# Patient Record
Sex: Female | Born: 1965
Health system: Southern US, Community
[De-identification: ages and names within clinical notes are randomized; demographics above are authoritative.]

---

## 2001-07-10 ENCOUNTER — Other Ambulatory Visit: Admission: RE | Admit: 2001-07-10 | Discharge: 2001-07-10 | Payer: Self-pay | Admitting: Dermatology

## 2009-01-31 ENCOUNTER — Ambulatory Visit (HOSPITAL_COMMUNITY): Admission: RE | Admit: 2009-01-31 | Discharge: 2009-01-31 | Payer: Self-pay | Admitting: Family Medicine

## 2012-02-29 ENCOUNTER — Other Ambulatory Visit (HOSPITAL_COMMUNITY): Payer: Self-pay | Admitting: Internal Medicine

## 2012-02-29 DIAGNOSIS — Z139 Encounter for screening, unspecified: Secondary | ICD-10-CM

## 2012-03-06 ENCOUNTER — Ambulatory Visit (HOSPITAL_COMMUNITY)
Admission: RE | Admit: 2012-03-06 | Discharge: 2012-03-06 | Disposition: A | Discharge status: Home / Self Care | Payer: Managed Care, Other (non HMO) | Source: Ambulatory Visit | Attending: Internal Medicine | Admitting: Internal Medicine

## 2012-03-06 DIAGNOSIS — Z1231 Encounter for screening mammogram for malignant neoplasm of breast: Secondary | ICD-10-CM | POA: Insufficient documentation

## 2012-03-06 DIAGNOSIS — Z139 Encounter for screening, unspecified: Secondary | ICD-10-CM

## 2013-03-11 ENCOUNTER — Other Ambulatory Visit (HOSPITAL_COMMUNITY): Payer: Self-pay | Admitting: Internal Medicine

## 2013-03-11 DIAGNOSIS — Z0000 Encounter for general adult medical examination without abnormal findings: Secondary | ICD-10-CM

## 2013-03-23 ENCOUNTER — Ambulatory Visit (HOSPITAL_COMMUNITY)
Admission: RE | Admit: 2013-03-23 | Discharge: 2013-03-23 | Disposition: A | Discharge status: Home / Self Care | Payer: Managed Care, Other (non HMO) | Source: Ambulatory Visit | Attending: Internal Medicine | Admitting: Internal Medicine

## 2013-03-23 DIAGNOSIS — Z0000 Encounter for general adult medical examination without abnormal findings: Secondary | ICD-10-CM

## 2013-03-23 DIAGNOSIS — Z1231 Encounter for screening mammogram for malignant neoplasm of breast: Secondary | ICD-10-CM | POA: Insufficient documentation

## 2014-03-19 ENCOUNTER — Other Ambulatory Visit (HOSPITAL_COMMUNITY): Payer: Self-pay | Admitting: Family Medicine

## 2014-03-19 DIAGNOSIS — Z0000 Encounter for general adult medical examination without abnormal findings: Secondary | ICD-10-CM

## 2014-03-29 ENCOUNTER — Ambulatory Visit (HOSPITAL_COMMUNITY)
Admission: RE | Admit: 2014-03-29 | Discharge: 2014-03-29 | Disposition: A | Discharge status: Home / Self Care | Payer: Managed Care, Other (non HMO) | Source: Ambulatory Visit | Attending: Family Medicine | Admitting: Family Medicine

## 2014-03-29 DIAGNOSIS — Z0000 Encounter for general adult medical examination without abnormal findings: Secondary | ICD-10-CM

## 2014-03-29 DIAGNOSIS — Z1231 Encounter for screening mammogram for malignant neoplasm of breast: Secondary | ICD-10-CM | POA: Insufficient documentation

## 2014-11-25 ENCOUNTER — Ambulatory Visit (HOSPITAL_COMMUNITY)
Admission: RE | Admit: 2014-11-25 | Discharge: 2014-11-25 | Disposition: A | Discharge status: Home / Self Care | Payer: Managed Care, Other (non HMO) | Source: Ambulatory Visit | Attending: Family Medicine | Admitting: Family Medicine

## 2014-11-25 ENCOUNTER — Other Ambulatory Visit (HOSPITAL_COMMUNITY): Payer: Self-pay | Admitting: Family Medicine

## 2014-11-25 DIAGNOSIS — M25562 Pain in left knee: Secondary | ICD-10-CM | POA: Insufficient documentation

## 2015-04-07 ENCOUNTER — Other Ambulatory Visit (HOSPITAL_COMMUNITY): Payer: Self-pay | Admitting: Family Medicine

## 2015-04-07 DIAGNOSIS — Z139 Encounter for screening, unspecified: Secondary | ICD-10-CM

## 2015-04-14 ENCOUNTER — Ambulatory Visit (HOSPITAL_COMMUNITY)
Admission: RE | Admit: 2015-04-14 | Discharge: 2015-04-14 | Disposition: A | Discharge status: Home / Self Care | Payer: Managed Care, Other (non HMO) | Source: Ambulatory Visit | Attending: Family Medicine | Admitting: Family Medicine

## 2015-04-14 DIAGNOSIS — Z1231 Encounter for screening mammogram for malignant neoplasm of breast: Secondary | ICD-10-CM | POA: Diagnosis not present

## 2015-04-14 DIAGNOSIS — Z139 Encounter for screening, unspecified: Secondary | ICD-10-CM

## 2016-04-04 ENCOUNTER — Other Ambulatory Visit (HOSPITAL_COMMUNITY): Payer: Self-pay | Admitting: Family Medicine

## 2016-04-04 DIAGNOSIS — Z1231 Encounter for screening mammogram for malignant neoplasm of breast: Secondary | ICD-10-CM

## 2016-04-16 ENCOUNTER — Ambulatory Visit (HOSPITAL_COMMUNITY)
Admission: RE | Admit: 2016-04-16 | Discharge: 2016-04-16 | Disposition: A | Discharge status: Home / Self Care | Payer: 59 | Source: Ambulatory Visit | Attending: Family Medicine | Admitting: Family Medicine

## 2016-04-16 DIAGNOSIS — Z1231 Encounter for screening mammogram for malignant neoplasm of breast: Secondary | ICD-10-CM | POA: Diagnosis not present

## 2017-02-08 DIAGNOSIS — Z1389 Encounter for screening for other disorder: Secondary | ICD-10-CM | POA: Diagnosis not present

## 2017-02-08 DIAGNOSIS — E782 Mixed hyperlipidemia: Secondary | ICD-10-CM | POA: Diagnosis not present

## 2017-02-08 DIAGNOSIS — E119 Type 2 diabetes mellitus without complications: Secondary | ICD-10-CM | POA: Diagnosis not present

## 2017-02-08 DIAGNOSIS — E1165 Type 2 diabetes mellitus with hyperglycemia: Secondary | ICD-10-CM | POA: Diagnosis not present

## 2017-02-08 DIAGNOSIS — J302 Other seasonal allergic rhinitis: Secondary | ICD-10-CM | POA: Diagnosis not present

## 2017-07-19 DIAGNOSIS — E1165 Type 2 diabetes mellitus with hyperglycemia: Secondary | ICD-10-CM | POA: Diagnosis not present

## 2017-07-19 DIAGNOSIS — R002 Palpitations: Secondary | ICD-10-CM | POA: Diagnosis not present

## 2017-07-19 DIAGNOSIS — Z23 Encounter for immunization: Secondary | ICD-10-CM | POA: Diagnosis not present

## 2017-07-19 DIAGNOSIS — Z0001 Encounter for general adult medical examination with abnormal findings: Secondary | ICD-10-CM | POA: Diagnosis not present

## 2017-07-19 DIAGNOSIS — Z1389 Encounter for screening for other disorder: Secondary | ICD-10-CM | POA: Diagnosis not present

## 2017-08-26 DIAGNOSIS — B354 Tinea corporis: Secondary | ICD-10-CM | POA: Diagnosis not present

## 2017-08-26 DIAGNOSIS — L299 Pruritus, unspecified: Secondary | ICD-10-CM | POA: Diagnosis not present

## 2017-09-03 ENCOUNTER — Other Ambulatory Visit (HOSPITAL_COMMUNITY): Payer: Self-pay | Admitting: Family Medicine

## 2017-09-03 DIAGNOSIS — Z1231 Encounter for screening mammogram for malignant neoplasm of breast: Secondary | ICD-10-CM

## 2017-09-04 ENCOUNTER — Ambulatory Visit (HOSPITAL_COMMUNITY): Payer: 59

## 2017-09-05 ENCOUNTER — Encounter (HOSPITAL_COMMUNITY): Payer: Self-pay

## 2017-09-05 ENCOUNTER — Ambulatory Visit (HOSPITAL_COMMUNITY)
Admission: RE | Admit: 2017-09-05 | Discharge: 2017-09-05 | Disposition: A | Discharge status: Home / Self Care | Payer: 59 | Source: Ambulatory Visit | Attending: Family Medicine | Admitting: Family Medicine

## 2017-09-05 DIAGNOSIS — Z1231 Encounter for screening mammogram for malignant neoplasm of breast: Secondary | ICD-10-CM | POA: Insufficient documentation

## 2017-10-04 ENCOUNTER — Other Ambulatory Visit (HOSPITAL_COMMUNITY): Payer: Self-pay | Admitting: Family Medicine

## 2017-10-04 DIAGNOSIS — Z1231 Encounter for screening mammogram for malignant neoplasm of breast: Secondary | ICD-10-CM

## 2017-11-01 DIAGNOSIS — E119 Type 2 diabetes mellitus without complications: Secondary | ICD-10-CM | POA: Diagnosis not present

## 2017-11-01 DIAGNOSIS — E782 Mixed hyperlipidemia: Secondary | ICD-10-CM | POA: Diagnosis not present

## 2017-11-01 DIAGNOSIS — E1165 Type 2 diabetes mellitus with hyperglycemia: Secondary | ICD-10-CM | POA: Diagnosis not present

## 2017-12-13 DIAGNOSIS — J069 Acute upper respiratory infection, unspecified: Secondary | ICD-10-CM | POA: Diagnosis not present

## 2018-02-21 DIAGNOSIS — R7309 Other abnormal glucose: Secondary | ICD-10-CM | POA: Diagnosis not present

## 2018-02-21 DIAGNOSIS — I10 Essential (primary) hypertension: Secondary | ICD-10-CM | POA: Diagnosis not present

## 2018-02-21 DIAGNOSIS — E6609 Other obesity due to excess calories: Secondary | ICD-10-CM | POA: Diagnosis not present

## 2018-02-21 DIAGNOSIS — Z6836 Body mass index (BMI) 36.0-36.9, adult: Secondary | ICD-10-CM | POA: Diagnosis not present

## 2018-07-18 DIAGNOSIS — Z0001 Encounter for general adult medical examination with abnormal findings: Secondary | ICD-10-CM | POA: Diagnosis not present

## 2018-07-18 DIAGNOSIS — I10 Essential (primary) hypertension: Secondary | ICD-10-CM | POA: Diagnosis not present

## 2018-07-18 DIAGNOSIS — Z1389 Encounter for screening for other disorder: Secondary | ICD-10-CM | POA: Diagnosis not present

## 2018-07-18 DIAGNOSIS — Z23 Encounter for immunization: Secondary | ICD-10-CM | POA: Diagnosis not present

## 2018-07-18 DIAGNOSIS — R7309 Other abnormal glucose: Secondary | ICD-10-CM | POA: Diagnosis not present

## 2018-09-08 ENCOUNTER — Encounter (HOSPITAL_COMMUNITY): Payer: Self-pay | Admitting: Radiology

## 2018-09-08 ENCOUNTER — Ambulatory Visit (HOSPITAL_COMMUNITY)
Admission: RE | Admit: 2018-09-08 | Discharge: 2018-09-08 | Disposition: A | Discharge status: Home / Self Care | Payer: 59 | Source: Ambulatory Visit | Attending: Family Medicine | Admitting: Family Medicine

## 2018-09-08 DIAGNOSIS — Z1231 Encounter for screening mammogram for malignant neoplasm of breast: Secondary | ICD-10-CM | POA: Diagnosis present

## 2018-10-20 DIAGNOSIS — B351 Tinea unguium: Secondary | ICD-10-CM | POA: Diagnosis not present

## 2018-10-20 DIAGNOSIS — R7309 Other abnormal glucose: Secondary | ICD-10-CM | POA: Diagnosis not present

## 2019-09-11 DIAGNOSIS — Z23 Encounter for immunization: Secondary | ICD-10-CM | POA: Diagnosis not present

## 2019-09-11 DIAGNOSIS — R7309 Other abnormal glucose: Secondary | ICD-10-CM | POA: Diagnosis not present

## 2019-09-11 DIAGNOSIS — Z1389 Encounter for screening for other disorder: Secondary | ICD-10-CM | POA: Diagnosis not present

## 2019-09-11 DIAGNOSIS — J302 Other seasonal allergic rhinitis: Secondary | ICD-10-CM | POA: Diagnosis not present

## 2019-09-11 DIAGNOSIS — Z0001 Encounter for general adult medical examination with abnormal findings: Secondary | ICD-10-CM | POA: Diagnosis not present

## 2019-09-11 DIAGNOSIS — Z6836 Body mass index (BMI) 36.0-36.9, adult: Secondary | ICD-10-CM | POA: Diagnosis not present

## 2019-09-11 DIAGNOSIS — E6609 Other obesity due to excess calories: Secondary | ICD-10-CM | POA: Diagnosis not present

## 2019-09-25 ENCOUNTER — Ambulatory Visit: Payer: BC Managed Care – PPO | Admitting: Podiatry

## 2019-09-25 ENCOUNTER — Ambulatory Visit (INDEPENDENT_AMBULATORY_CARE_PROVIDER_SITE_OTHER): Payer: BC Managed Care – PPO

## 2019-09-25 ENCOUNTER — Other Ambulatory Visit: Payer: Self-pay

## 2019-09-25 DIAGNOSIS — L603 Nail dystrophy: Secondary | ICD-10-CM

## 2019-09-25 DIAGNOSIS — M7752 Other enthesopathy of left foot: Secondary | ICD-10-CM | POA: Diagnosis not present

## 2019-09-25 DIAGNOSIS — B351 Tinea unguium: Secondary | ICD-10-CM | POA: Diagnosis not present

## 2019-09-25 MED ORDER — FLUCONAZOLE 150 MG PO TABS: 150.0000 mg | ORAL_TABLET | ORAL | 1 refills | Status: DC

## 2019-09-25 NOTE — Patient Instructions (Signed)
Fungal Nail Infection A fungal nail infection is a common infection of the toenails or fingernails. This condition affects toenails more often than fingernails. It often affects the great, or big, toes. More than one nail may be infected. The condition can be passed from person to person (is contagious). What are the causes? This condition is caused by a fungus. Several types of fungi can cause the infection. These fungi are common in moist and warm areas. If your hands or feet come into contact with the fungus, it may get into a crack in your fingernail or toenail and cause the infection. What increases the risk? The following factors may make you more likely to develop this condition:  Being female.  Being of older age.  Living with someone who has the fungus.  Walking barefoot in areas where the fungus thrives, such as showers or locker rooms.  Wearing shoes and socks that cause your feet to sweat.  Having a nail injury or a recent nail surgery.  Having certain medical conditions, such as: ? Athlete's foot. ? Diabetes. ? Psoriasis. ? Poor circulation. ? A weak body defense system (immune system). What are the signs or symptoms? Symptoms of this condition include:  A pale spot on the nail.  Thickening of the nail.  A nail that becomes yellow or brown.  A brittle or ragged nail edge.  A crumbling nail.  A nail that has lifted away from the nail bed. How is this diagnosed? This condition is diagnosed with a physical exam. Your health care provider may take a scraping or clipping from your nail to test for the fungus. How is this treated? Treatment is not needed for mild infections. If you have significant nail changes, treatment may include:  Antifungal medicines taken by mouth (orally). You may need to take the medicine for several weeks or several months, and you may not see the results for a long time. These medicines can cause side effects. Ask your health care provider  what problems to watch for.  Antifungal nail polish or nail cream. These may be used along with oral antifungal medicines.  Laser treatment of the nail.  Surgery to remove the nail. This may be needed for the most severe infections. It can take a long time, usually up to a year, for the infection to go away. The infection may also come back. Follow these instructions at home: Medicines  Take or apply over-the-counter and prescription medicines only as told by your health care provider.  Ask your health care provider about using over-the-counter mentholated ointment on your nails. Nail care  Trim your nails often.  Wash and dry your hands and feet every day.  Keep your feet dry: ? Wear absorbent socks, and change your socks frequently. ? Wear shoes that allow air to circulate, such as sandals or canvas tennis shoes. Throw out old shoes.  Do not use artificial nails.  If you go to a nail salon, make sure you choose one that uses clean instruments.  Use antifungal foot powder on your feet and in your shoes. General instructions  Do not share personal items, such as towels or nail clippers.  Do not walk barefoot in shower rooms or locker rooms.  Wear rubber gloves if you are working with your hands in wet areas.  Keep all follow-up visits as told by your health care provider. This is important. Contact a health care provider if: Your infection is not getting better or it is getting worse   after several months. Summary  A fungal nail infection is a common infection of the toenails or fingernails.  Treatment is not needed for mild infections. If you have significant nail changes, treatment may include taking medicine orally and applying medicine to your nails.  It can take a long time, usually up to a year, for the infection to go away. The infection may also come back.  Take or apply over-the-counter and prescription medicines only as told by your health care provider.   Follow instructions for taking care of your nails to help prevent infection from coming back or spreading. This information is not intended to replace advice given to you by your health care provider. Make sure you discuss any questions you have with your health care provider. Document Released: 10/05/2000 Document Revised: 01/29/2019 Document Reviewed: 03/14/2018 Elsevier Patient Education  2020 [employer]  

## 2019-09-25 NOTE — Progress Notes (Signed)
  Subjective:  Patient ID: Haley Holmes, female    DOB: 1966-01-06,  MRN: [id number]  Chief Complaint  Patient presents with  . New Patient (Initial Visit)    b/l toe nail discoloration and thickness - possible fungus , pt has hx of getting them    53 y.o. female presents with the above complaint. Hx above confirmed with patient.  Review of Systems: Negative except as noted in the HPI. Denies N/V/F/Ch.  No past medical history on file.  Current Outpatient Medications:  .  fluconazole (DIFLUCAN) 150 MG tablet, Take 1 tablet (150 mg total) by mouth once a week., Disp: 6 tablet, Rfl: 1  Social History   Tobacco Use  Smoking Status Not on file    Allergies  Allergen Reactions  . Lamisil [Terbinafine Hcl] Rash   Objective:  There were no vitals filed for this visit. There is no height or weight on file to calculate BMI. Constitutional Well developed. Well nourished.  Vascular Dorsalis pedis pulses palpable bilaterally. Posterior tibial pulses palpable bilaterally. Capillary refill normal to all digits.  No cyanosis or clubbing noted. Pedal hair growth normal.  Neurologic Normal speech. Oriented to person, place, and time. Epicritic sensation to light touch grossly present bilaterally.  Dermatologic Nails thickened dystrophic yellow discoloration transverse ridging. No open wounds. No skin lesions.  Orthopedic: Normal joint ROM without pain or crepitus bilaterally. No visible deformities. No bony tenderness.   Radiographs: Taken and reviewed no underlying exostosis Assessment:   1. Bone spur of left foot   2. Onychomycosis   3. Nail dystrophy    Plan:  Patient was evaluated and treated and all questions answered.  Onychomycosis -XR taken no underlying exostosis -Educated on etiology of nail fungus. -eRx for weekly fluconazole. Educated on use. -Recommend tolcylen for traumatic component. -Recommended against removal ofhte nail at this time. -Nail debrided  with nail burr and nipper  Return in about 2 months (around 11/26/2019).

## 2019-09-29 ENCOUNTER — Other Ambulatory Visit (HOSPITAL_COMMUNITY): Payer: Self-pay | Admitting: Family Medicine

## 2019-09-29 DIAGNOSIS — Z1231 Encounter for screening mammogram for malignant neoplasm of breast: Secondary | ICD-10-CM

## 2019-10-30 ENCOUNTER — Telehealth: Payer: Self-pay | Admitting: Podiatry

## 2019-10-30 ENCOUNTER — Ambulatory Visit (HOSPITAL_COMMUNITY)
Admission: RE | Admit: 2019-10-30 | Discharge: 2019-10-30 | Disposition: A | Discharge status: Home / Self Care | Payer: BC Managed Care – PPO | Source: Ambulatory Visit | Attending: Family Medicine | Admitting: Family Medicine

## 2019-10-30 ENCOUNTER — Other Ambulatory Visit: Payer: Self-pay

## 2019-10-30 DIAGNOSIS — Z1231 Encounter for screening mammogram for malignant neoplasm of breast: Secondary | ICD-10-CM | POA: Insufficient documentation

## 2019-10-30 NOTE — Telephone Encounter (Signed)
Pts husband came by office stating they had bought a product from our office on 09/25/19 and used their Health [employer]SA Card to purchase the product. Pt was able to find a similar product at their drug store for much cheaper and would like to return it. The product has not been opened. Pts husband would like a call from our billing department to discuss processing the credit.

## 2019-11-27 ENCOUNTER — Other Ambulatory Visit: Payer: Self-pay

## 2019-11-27 ENCOUNTER — Ambulatory Visit: Payer: BC Managed Care – PPO | Admitting: Podiatry

## 2019-11-27 DIAGNOSIS — B351 Tinea unguium: Secondary | ICD-10-CM | POA: Diagnosis not present

## 2019-11-27 DIAGNOSIS — L603 Nail dystrophy: Secondary | ICD-10-CM | POA: Diagnosis not present

## 2019-11-27 NOTE — Progress Notes (Signed)
  Subjective:  Patient ID: Haley Holmes, female    DOB: 10-03-1966,  MRN: [id number]  Chief Complaint  Patient presents with  . Nail Problem    Left 1st toenail check. Pt denies pain and drainage. Pt states "I think it is doing better".    54 y.o. female presents with the above complaint. History confirmed with patient.   Objective:  Physical Exam: warm, good capillary refill, nail exam slight yellowing distally but proximal clear growth, no trophic changes or ulcerative lesions, normal DP and PT pulses and normal sensory exam. Left Foot: normal exam, no swelling, tenderness, instability; ligaments intact, full range of motion of all ankle/foot joints  Right Foot: normal exam, no swelling, tenderness, instability; ligaments intact, full range of motion of all ankle/foot joints   Assessment:   1. Onychomycosis   2. Nail dystrophy    Plan:  Patient was evaluated and treated and all questions answered.  Onychomycosis -Improving. -Continue fluconazole to completion -Nails gently debrided of excess thickness -Continue funginail solution -F/u PRN  Return if symptoms worsen or fail to improve.

## 2020-06-03 DIAGNOSIS — K648 Other hemorrhoids: Secondary | ICD-10-CM | POA: Diagnosis not present

## 2020-06-03 DIAGNOSIS — K5730 Diverticulosis of large intestine without perforation or abscess without bleeding: Secondary | ICD-10-CM | POA: Diagnosis not present

## 2020-06-03 DIAGNOSIS — K635 Polyp of colon: Secondary | ICD-10-CM | POA: Diagnosis not present

## 2020-06-03 DIAGNOSIS — D120 Benign neoplasm of cecum: Secondary | ICD-10-CM | POA: Diagnosis not present

## 2020-06-03 DIAGNOSIS — Z1211 Encounter for screening for malignant neoplasm of colon: Secondary | ICD-10-CM | POA: Diagnosis not present

## 2020-11-11 ENCOUNTER — Other Ambulatory Visit (HOSPITAL_COMMUNITY): Payer: Self-pay | Admitting: Family Medicine

## 2020-11-11 DIAGNOSIS — Z1231 Encounter for screening mammogram for malignant neoplasm of breast: Secondary | ICD-10-CM

## 2020-11-18 ENCOUNTER — Other Ambulatory Visit: Payer: Self-pay

## 2020-11-18 ENCOUNTER — Ambulatory Visit (HOSPITAL_COMMUNITY)
Admission: RE | Admit: 2020-11-18 | Discharge: 2020-11-18 | Disposition: A | Discharge status: Home / Self Care | Payer: 59 | Source: Ambulatory Visit | Attending: Family Medicine | Admitting: Family Medicine

## 2020-11-18 DIAGNOSIS — Z1231 Encounter for screening mammogram for malignant neoplasm of breast: Secondary | ICD-10-CM | POA: Diagnosis present

## 2021-08-14 IMAGING — MG DIGITAL SCREENING BILAT W/ TOMO W/ CAD
6 of 12 series · 6 of 36 positions shown · non-contrast
Comparison: Previous exam(s).

ACR Breast Density Category a: The breast tissue is almost entirely
fatty.

CLINICAL DATA: Screening.

EXAM:
DIGITAL SCREENING BILATERAL MAMMOGRAM WITH TOMO AND CAD

[R MLO synth-2D]
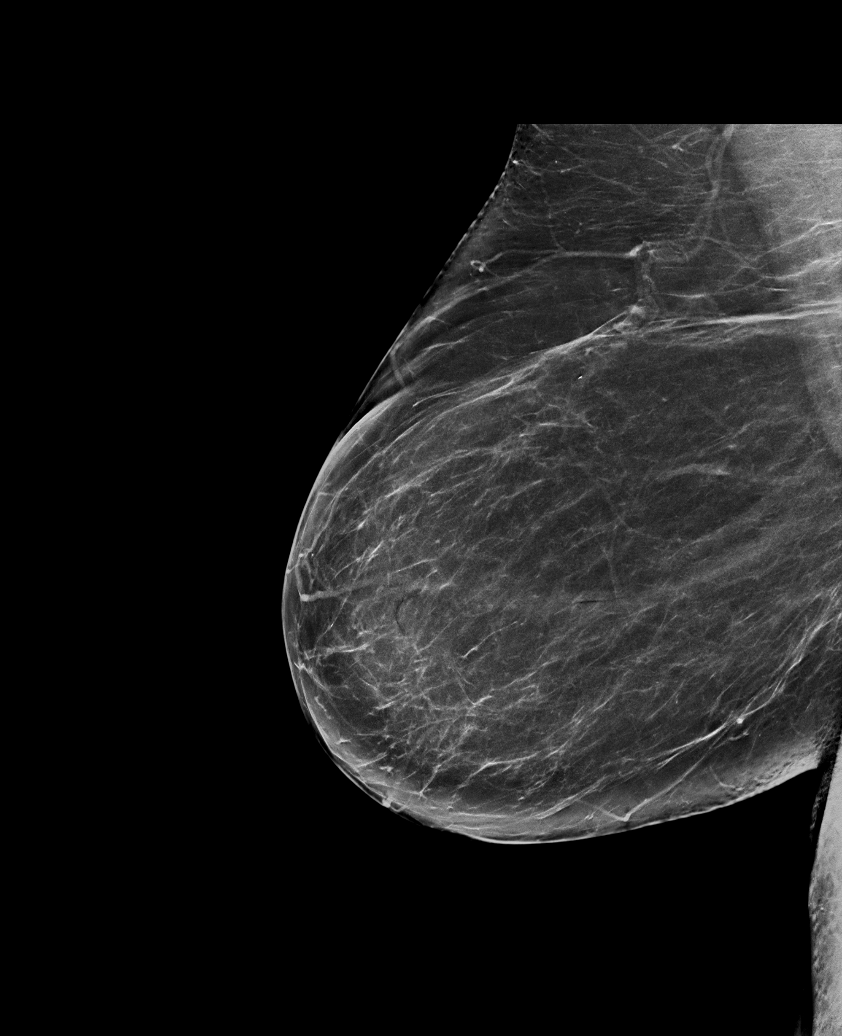

[L CC synth-2D (1 of 2)]
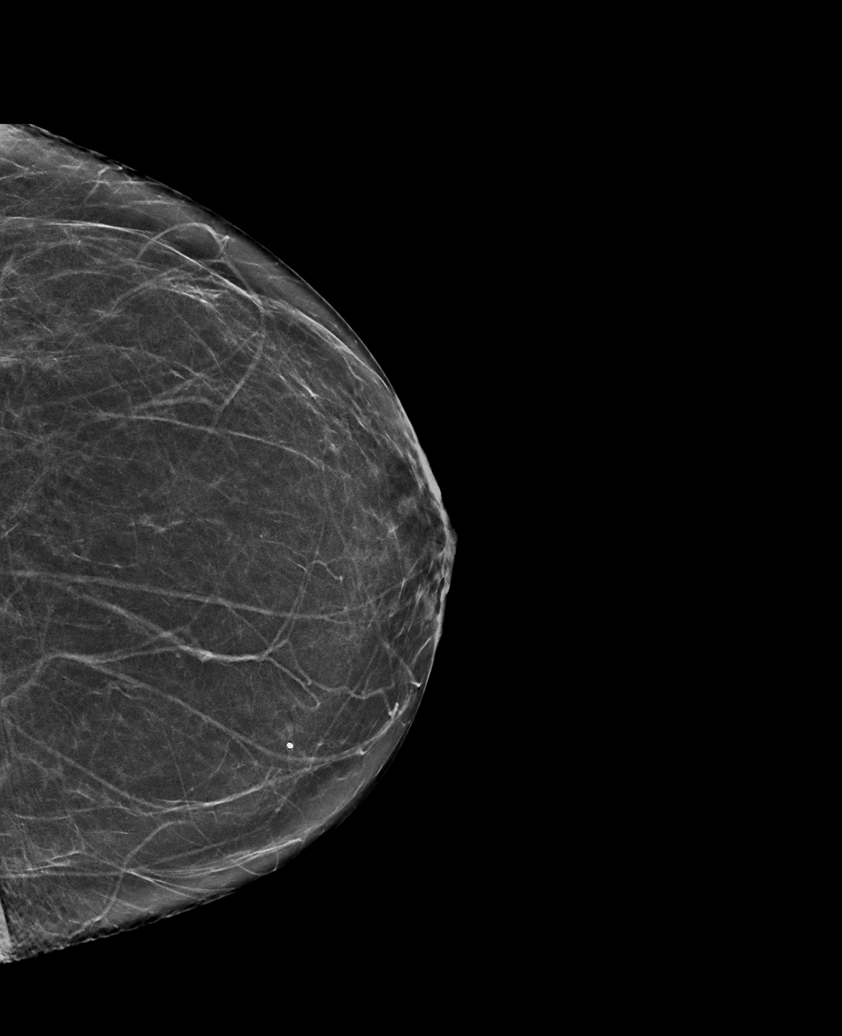

[R CC synth-2D (1 of 2)]
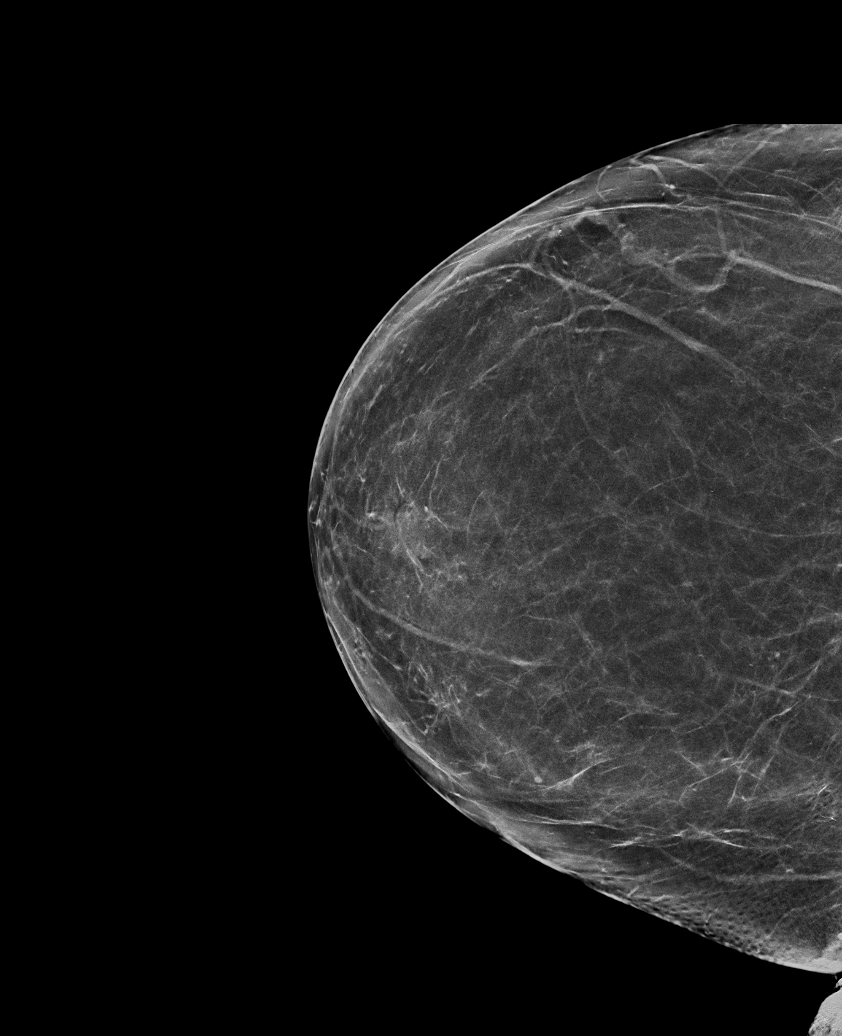

[L CC synth-2D (2 of 2)]
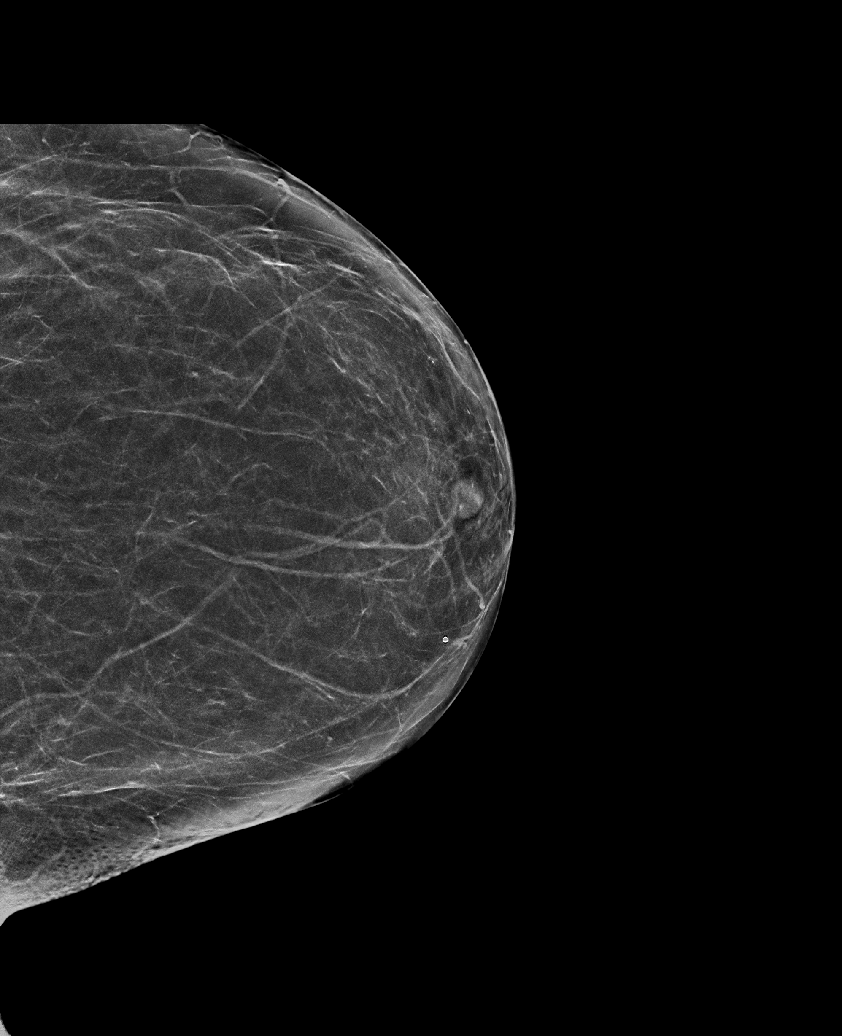

[R CC synth-2D (2 of 2)]
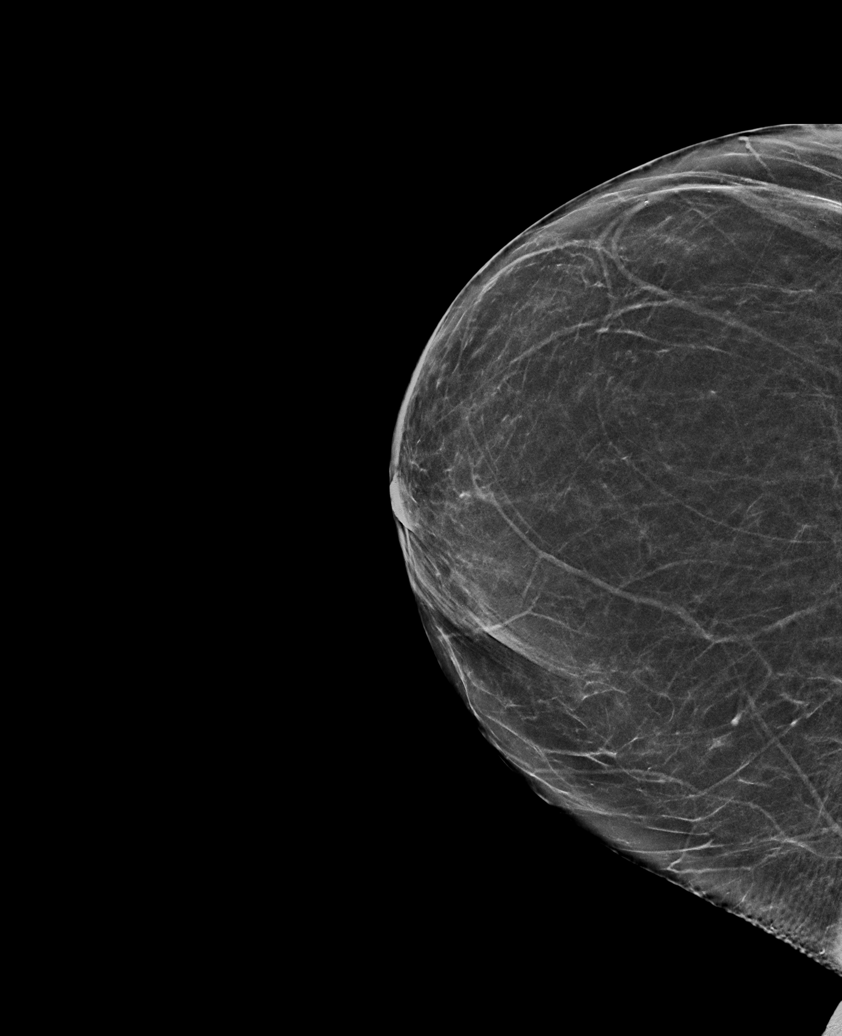

[L MLO synth-2D]
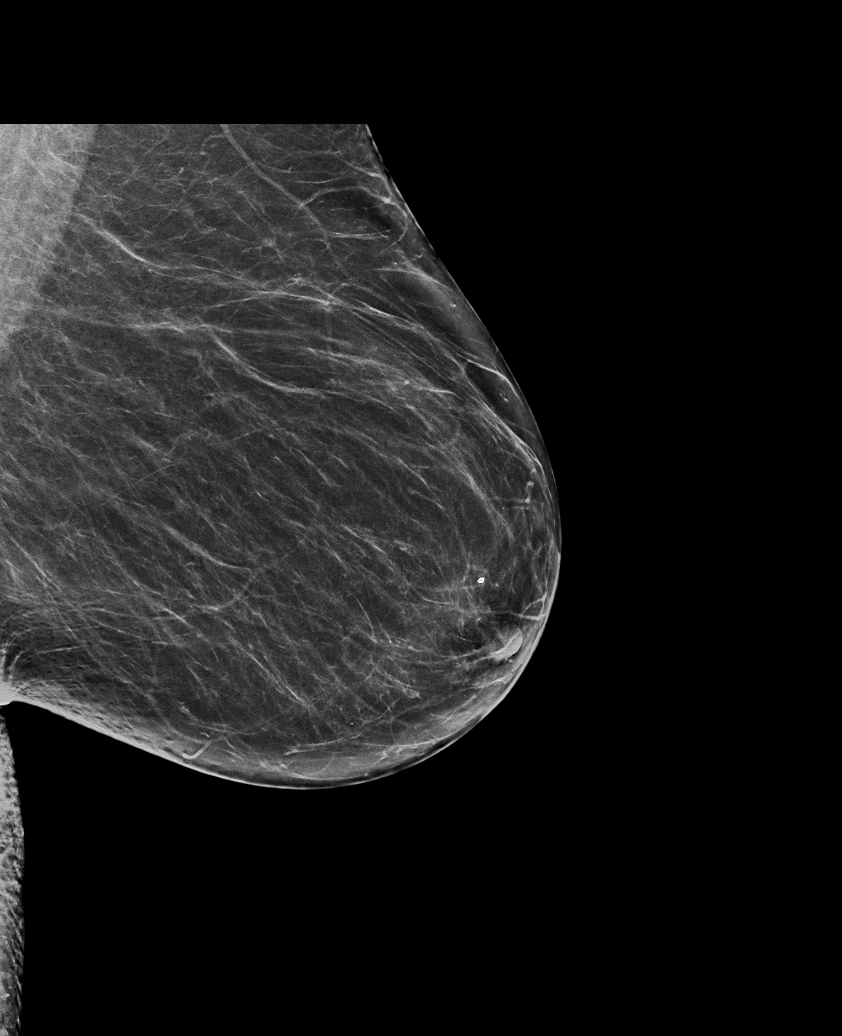

[6 of 36 positions shown; findings below may reference images not displayed]

FINDINGS: There are no findings suspicious for malignancy. Images were
processed with CAD.
IMPRESSION: No mammographic evidence of malignancy. A result letter of this
screening mammogram will be mailed directly to the patient.

RECOMMENDATION:
Screening mammogram in one year. (Code:[id number]-[id number])

BI-RADS CATEGORY  1: Negative.

## 2022-05-16 ENCOUNTER — Other Ambulatory Visit (HOSPITAL_COMMUNITY): Payer: Self-pay | Admitting: Family Medicine

## 2022-05-16 ENCOUNTER — Other Ambulatory Visit: Payer: Self-pay | Admitting: Family Medicine

## 2022-05-16 DIAGNOSIS — R1011 Right upper quadrant pain: Secondary | ICD-10-CM

## 2022-05-18 ENCOUNTER — Ambulatory Visit
Admission: RE | Admit: 2022-05-18 | Discharge: 2022-05-18 | Disposition: A | Discharge status: Home / Self Care | Payer: Self-pay | Source: Ambulatory Visit | Attending: Family Medicine | Admitting: Family Medicine

## 2022-05-18 DIAGNOSIS — R1011 Right upper quadrant pain: Secondary | ICD-10-CM

## 2022-06-29 ENCOUNTER — Other Ambulatory Visit: Payer: Self-pay | Admitting: Surgery

## 2022-10-05 ENCOUNTER — Other Ambulatory Visit (HOSPITAL_COMMUNITY): Payer: Self-pay | Admitting: Family Medicine

## 2022-10-05 DIAGNOSIS — Z0001 Encounter for general adult medical examination with abnormal findings: Secondary | ICD-10-CM | POA: Diagnosis not present

## 2022-10-05 DIAGNOSIS — E669 Obesity, unspecified: Secondary | ICD-10-CM | POA: Diagnosis not present

## 2022-10-05 DIAGNOSIS — Z1231 Encounter for screening mammogram for malignant neoplasm of breast: Secondary | ICD-10-CM

## 2022-10-08 ENCOUNTER — Ambulatory Visit (HOSPITAL_COMMUNITY)
Admission: RE | Admit: 2022-10-08 | Discharge: 2022-10-08 | Disposition: A | Discharge status: Home / Self Care | Payer: 59 | Source: Ambulatory Visit | Attending: Family Medicine | Admitting: Family Medicine

## 2022-10-08 DIAGNOSIS — Z1231 Encounter for screening mammogram for malignant neoplasm of breast: Secondary | ICD-10-CM | POA: Insufficient documentation

## 2022-12-21 ENCOUNTER — Other Ambulatory Visit: Payer: Self-pay | Admitting: Surgery

## 2022-12-27 ENCOUNTER — Other Ambulatory Visit: Payer: Self-pay

## 2022-12-27 ENCOUNTER — Encounter (HOSPITAL_BASED_OUTPATIENT_CLINIC_OR_DEPARTMENT_OTHER): Payer: Self-pay | Admitting: Surgery

## 2022-12-28 MED ORDER — ENSURE PRE-SURGERY PO LIQD: 296.0000 mL | Freq: Once | ORAL | Status: DC

## 2022-12-28 NOTE — Progress Notes (Signed)

## 2023-01-02 MED ORDER — VANCOMYCIN HCL 1000 MG IV SOLR
INTRAVENOUS | Status: AC
  Filled 2023-01-02: qty 20

## 2023-01-02 NOTE — H&P (Signed)
  REFERRING PHYSICIAN: Halford Chessman, MD  PROVIDER: Beverlee Nims, MD  MRN: [id number] DOB: May 21, 1966  Subjective  Chief Complaint: New Consultation (Gallstones)   History of Present Illness: Haley Holmes is a 57 y.o. female who is seen as an office consultation for evaluation of New Consultation (Gallstones) .  This is a very pleasant patient who is referred to me for symptomatic gallstones. Since January of this year she has had multiple attacks of right upper quadrant abdominal pain and bloating. Her most recent attack in July she also had nausea and vomiting for several days. She has significant sciatica and at that time was also placed on steroids. She has not had an attack since July. She describes her pain as sharp and severe at times. She denies jaundice. She is otherwise without complaints. She has had no previous abdominal surgery. She has no cardiopulmonary issues.  Review of Systems: A complete review of systems was obtained from the patient. I have reviewed this information and discussed as appropriate with the patient. See HPI as well for other ROS.  ROS  Medical History: History reviewed. No pertinent past medical history.  There is no problem list on file for this patient.  History reviewed. No pertinent surgical history.  Allergies Allergen Reactions Terbinafine Hcl Rash  No current outpatient medications on file prior to visit.  No current facility-administered medications on file prior to visit.  History reviewed. No pertinent family history.  Social History  Tobacco Use Smoking Status Never Smokeless Tobacco Never   Social History  Socioeconomic History Marital status: Married Tobacco Use Smoking status: Never Smokeless tobacco: Never Substance and Sexual Activity Alcohol use: Not Currently Drug use: Never  Objective:  Vitals:  BP: (!) 142/80 Pulse: 74 Temp: 36.2 C (97.1 F) SpO2: 99% Weight: 88.5 kg (195 lb 3.2  oz) Height: 157.5 cm (5\' 2" )  Body mass index is 35.7 kg/m.  Physical Exam  She appears well on exam today  Her abdomen is soft and nontender. There is no hepatomegaly  Labs, Imaging and Diagnostic Testing: I reviewed her ultrasound showing gallstones. The gallbladder wall is normal. There is no ductal dilation  Assessment and Plan:  Diagnoses and all orders for this visit:  Symptomatic cholelithiasis    I had a long discussion with the patient and her husband guarding symptomatic gallstones and the reasons to remove her gallbladder. I gave them literature regarding this. I explained the surgical procedure in detail. I discussed the risk of surgery which includes but is not limited to bleeding, infection, injury to surrounding structures, bile duct injury, bile leak, the need to convert to an open procedure, cardiopulmonary issues, postoperative recovery, etc. We discussed the risks of cholecystitis or pancreatitis without surgery. At this point, they are going to discuss this further once they get home and then decide on proceeding with surgery and the timing of the surgery. I encouraged her to call if she has any attack of significance or if she needs to discuss things further.

## 2023-01-03 ENCOUNTER — Ambulatory Visit (HOSPITAL_BASED_OUTPATIENT_CLINIC_OR_DEPARTMENT_OTHER)
Admission: RE | Admit: 2023-01-03 | Discharge: 2023-01-03 | Disposition: A | Discharge status: Home / Self Care | Payer: 59 | Attending: Surgery | Admitting: Surgery

## 2023-01-03 ENCOUNTER — Encounter (HOSPITAL_BASED_OUTPATIENT_CLINIC_OR_DEPARTMENT_OTHER): Payer: Self-pay | Admitting: Surgery

## 2023-01-03 ENCOUNTER — Encounter (HOSPITAL_BASED_OUTPATIENT_CLINIC_OR_DEPARTMENT_OTHER)
Admission: RE | Disposition: A | Discharge status: Home / Self Care | Payer: Self-pay | Source: Home / Self Care | Attending: Surgery

## 2023-01-03 ENCOUNTER — Ambulatory Visit (HOSPITAL_BASED_OUTPATIENT_CLINIC_OR_DEPARTMENT_OTHER): Payer: 59 | Admitting: Anesthesiology

## 2023-01-03 ENCOUNTER — Other Ambulatory Visit: Payer: Self-pay

## 2023-01-03 DIAGNOSIS — K8020 Calculus of gallbladder without cholecystitis without obstruction: Secondary | ICD-10-CM | POA: Diagnosis not present

## 2023-01-03 DIAGNOSIS — K8010 Calculus of gallbladder with chronic cholecystitis without obstruction: Secondary | ICD-10-CM | POA: Insufficient documentation

## 2023-01-03 HISTORY — PX: CHOLECYSTECTOMY: SHX55

## 2023-01-03 SURGERY — LAPAROSCOPIC CHOLECYSTECTOMY
Anesthesia: General | Site: Abdomen

## 2023-01-03 MED ORDER — AMISULPRIDE (ANTIEMETIC) 5 MG/2ML IV SOLN
10.0000 mg | Freq: Once | INTRAVENOUS | Status: AC
  Administered 2023-01-03: 10 mg via INTRAVENOUS

## 2023-01-03 MED ORDER — KETOROLAC TROMETHAMINE 30 MG/ML IJ SOLN
INTRAMUSCULAR | Status: DC | PRN
  Administered 2023-01-03: 30 mg via INTRAVENOUS

## 2023-01-03 MED ORDER — SCOPOLAMINE 1 MG/3DAYS TD PT72
MEDICATED_PATCH | TRANSDERMAL | Status: AC
  Filled 2023-01-03: qty 1

## 2023-01-03 MED ORDER — CEFAZOLIN SODIUM-DEXTROSE 2-4 GM/100ML-% IV SOLN
2.0000 g | INTRAVENOUS | Status: AC
  Administered 2023-01-03: 2 g via INTRAVENOUS

## 2023-01-03 MED ORDER — DEXAMETHASONE SODIUM PHOSPHATE 4 MG/ML IJ SOLN
INTRAMUSCULAR | Status: DC | PRN
  Administered 2023-01-03: 8 mg via INTRAVENOUS

## 2023-01-03 MED ORDER — LACTATED RINGERS IV SOLN: INTRAVENOUS | Status: DC

## 2023-01-03 MED ORDER — LIDOCAINE HCL (CARDIAC) PF 100 MG/5ML IV SOSY
PREFILLED_SYRINGE | INTRAVENOUS | Status: DC | PRN
  Administered 2023-01-03: 50 mg via INTRAVENOUS

## 2023-01-03 MED ORDER — FENTANYL CITRATE (PF) 100 MCG/2ML IJ SOLN
INTRAMUSCULAR | Status: AC
  Filled 2023-01-03: qty 2

## 2023-01-03 MED ORDER — CEFAZOLIN SODIUM-DEXTROSE 2-4 GM/100ML-% IV SOLN
INTRAVENOUS | Status: AC
  Filled 2023-01-03: qty 100

## 2023-01-03 MED ORDER — ACETAMINOPHEN 500 MG PO TABS
1000.0000 mg | ORAL_TABLET | ORAL | Status: AC
  Administered 2023-01-03: 1000 mg via ORAL

## 2023-01-03 MED ORDER — LIDOCAINE 2% (20 MG/ML) 5 ML SYRINGE
INTRAMUSCULAR | Status: AC
  Filled 2023-01-03: qty 5

## 2023-01-03 MED ORDER — DEXAMETHASONE SODIUM PHOSPHATE 10 MG/ML IJ SOLN
INTRAMUSCULAR | Status: AC
  Filled 2023-01-03: qty 1

## 2023-01-03 MED ORDER — OXYCODONE HCL 5 MG PO TABS: 5.0000 mg | ORAL_TABLET | Freq: Four times a day (QID) | ORAL | 0 refills | Status: AC | PRN

## 2023-01-03 MED ORDER — HYDROMORPHONE HCL 1 MG/ML IJ SOLN
INTRAMUSCULAR | Status: AC
  Filled 2023-01-03: qty 0.5

## 2023-01-03 MED ORDER — MIDAZOLAM HCL 5 MG/5ML IJ SOLN
INTRAMUSCULAR | Status: DC | PRN
  Administered 2023-01-03: 2 mg via INTRAVENOUS

## 2023-01-03 MED ORDER — ROCURONIUM BROMIDE 10 MG/ML (PF) SYRINGE
PREFILLED_SYRINGE | INTRAVENOUS | Status: AC
  Filled 2023-01-03: qty 10

## 2023-01-03 MED ORDER — PROPOFOL 10 MG/ML IV BOLUS
INTRAVENOUS | Status: AC
  Filled 2023-01-03: qty 20

## 2023-01-03 MED ORDER — ACETAMINOPHEN 500 MG PO TABS
ORAL_TABLET | ORAL | Status: AC
  Filled 2023-01-03: qty 2

## 2023-01-03 MED ORDER — AMISULPRIDE (ANTIEMETIC) 5 MG/2ML IV SOLN
INTRAVENOUS | Status: AC
  Filled 2023-01-03: qty 4

## 2023-01-03 MED ORDER — ROCURONIUM BROMIDE 100 MG/10ML IV SOLN
INTRAVENOUS | Status: DC | PRN
  Administered 2023-01-03: 50 mg via INTRAVENOUS

## 2023-01-03 MED ORDER — DEXMEDETOMIDINE HCL IN NACL 80 MCG/20ML IV SOLN
INTRAVENOUS | Status: DC | PRN
  Administered 2023-01-03: 8 ug via BUCCAL

## 2023-01-03 MED ORDER — SCOPOLAMINE 1 MG/3DAYS TD PT72
1.0000 | MEDICATED_PATCH | TRANSDERMAL | Status: DC
  Administered 2023-01-03: 1.5 mg via TRANSDERMAL

## 2023-01-03 MED ORDER — CHLORHEXIDINE GLUCONATE CLOTH 2 % EX PADS: 6.0000 | MEDICATED_PAD | Freq: Once | CUTANEOUS | Status: DC

## 2023-01-03 MED ORDER — FENTANYL CITRATE (PF) 100 MCG/2ML IJ SOLN
INTRAMUSCULAR | Status: DC | PRN
  Administered 2023-01-03: 100 ug via INTRAVENOUS

## 2023-01-03 MED ORDER — KETOROLAC TROMETHAMINE 30 MG/ML IJ SOLN
INTRAMUSCULAR | Status: AC
  Filled 2023-01-03: qty 1

## 2023-01-03 MED ORDER — ONDANSETRON HCL 4 MG/2ML IJ SOLN
INTRAMUSCULAR | Status: AC
  Filled 2023-01-03: qty 2

## 2023-01-03 MED ORDER — ONDANSETRON HCL 4 MG/2ML IJ SOLN
INTRAMUSCULAR | Status: DC | PRN
  Administered 2023-01-03: 4 mg via INTRAVENOUS

## 2023-01-03 MED ORDER — OXYCODONE HCL 5 MG PO TABS
5.0000 mg | ORAL_TABLET | Freq: Once | ORAL | Status: AC
  Administered 2023-01-03: 5 mg via ORAL

## 2023-01-03 MED ORDER — SUGAMMADEX SODIUM 200 MG/2ML IV SOLN
INTRAVENOUS | Status: DC | PRN
  Administered 2023-01-03: 200 mg via INTRAVENOUS

## 2023-01-03 MED ORDER — PROPOFOL 10 MG/ML IV BOLUS
INTRAVENOUS | Status: DC | PRN
  Administered 2023-01-03: 180 mg via INTRAVENOUS

## 2023-01-03 MED ORDER — HYDROMORPHONE HCL 1 MG/ML IJ SOLN
0.2500 mg | INTRAMUSCULAR | Status: DC | PRN
  Administered 2023-01-03 (×2): 0.5 mg via INTRAVENOUS

## 2023-01-03 MED ORDER — BUPIVACAINE HCL (PF) 0.5 % IJ SOLN
INTRAMUSCULAR | Status: DC | PRN
  Administered 2023-01-03: 20 mL

## 2023-01-03 MED ORDER — MIDAZOLAM HCL 2 MG/2ML IJ SOLN
INTRAMUSCULAR | Status: AC
  Filled 2023-01-03: qty 2

## 2023-01-03 MED ORDER — OXYCODONE HCL 5 MG PO TABS
ORAL_TABLET | ORAL | Status: AC
  Filled 2023-01-03: qty 1

## 2023-01-03 SURGICAL SUPPLY — 38 items
ADH SKN CLS APL DERMABOND .7 (GAUZE/BANDAGES/DRESSINGS) ×1
APL PRP STRL LF DISP 70% ISPRP (MISCELLANEOUS) ×1
APPLIER CLIP 5 13 M/L LIGAMAX5 (MISCELLANEOUS) ×1
APR CLP MED LRG 5 ANG JAW (MISCELLANEOUS) ×1
BLADE CLIPPER SURG (BLADE) IMPLANT
CHLORAPREP W/TINT 26 (MISCELLANEOUS) ×1 IMPLANT
CLIP APPLIE 5 13 M/L LIGAMAX5 (MISCELLANEOUS) ×1 IMPLANT
COVER MAYO STAND STRL (DRAPES) IMPLANT
DERMABOND ADVANCED .7 DNX12 (GAUZE/BANDAGES/DRESSINGS) ×1 IMPLANT
DRAPE C-ARM 42X72 X-RAY (DRAPES) IMPLANT
ELECT REM PT RETURN 9FT ADLT (ELECTROSURGICAL) ×1
ELECTRODE REM PT RTRN 9FT ADLT (ELECTROSURGICAL) ×1 IMPLANT
GAUZE 4X4 16PLY ~~LOC~~+RFID DBL (SPONGE) ×1 IMPLANT
GLOVE SURG SIGNA 7.5 PF LTX (GLOVE) ×1 IMPLANT
GOWN STRL REUS W/ TWL LRG LVL3 (GOWN DISPOSABLE) ×2 IMPLANT
GOWN STRL REUS W/ TWL XL LVL3 (GOWN DISPOSABLE) ×1 IMPLANT
GOWN STRL REUS W/TWL LRG LVL3 (GOWN DISPOSABLE) ×2
GOWN STRL REUS W/TWL XL LVL3 (GOWN DISPOSABLE) ×1
HEMOSTAT SNOW SURGICEL 2X4 (HEMOSTASIS) IMPLANT
IRRIG SUCT STRYKERFLOW 2 WTIP (MISCELLANEOUS) ×1
IRRIGATION SUCT STRKRFLW 2 WTP (MISCELLANEOUS) ×1 IMPLANT
NS IRRIG 1000ML POUR BTL (IV SOLUTION) ×1 IMPLANT
PACK BASIN DAY SURGERY FS (CUSTOM PROCEDURE TRAY) ×1 IMPLANT
SCISSORS LAP 5X35 DISP (ENDOMECHANICALS) ×1 IMPLANT
SET CHOLANGIOGRAPH 5 50 .035 (SET/KITS/TRAYS/PACK) IMPLANT
SET TUBE SMOKE EVAC HIGH FLOW (TUBING) ×1 IMPLANT
SLEEVE ADV FIXATION 5X100MM (TROCAR) ×2 IMPLANT
SLEEVE SCD COMPRESS KNEE MED (STOCKING) ×1 IMPLANT
SPECIMEN JAR SMALL (MISCELLANEOUS) ×1 IMPLANT
SPIKE FLUID TRANSFER (MISCELLANEOUS) ×1 IMPLANT
SUT MON AB 4-0 PC3 18 (SUTURE) ×1 IMPLANT
SYS BAG RETRIEVAL 10MM (BASKET) ×1
SYSTEM BAG RETRIEVAL 10MM (BASKET) ×1 IMPLANT
TOWEL GREEN STERILE FF (TOWEL DISPOSABLE) ×1 IMPLANT
TRAY LAPAROSCOPIC (CUSTOM PROCEDURE TRAY) ×1 IMPLANT
TROCAR XCEL BLUNT TIP 100MML (ENDOMECHANICALS) ×1 IMPLANT
TROCAR XCEL NON-BLD 5MMX100MML (ENDOMECHANICALS) ×1 IMPLANT
TUBE CONNECTING 20X1/4 (TUBING) ×1 IMPLANT

## 2023-01-03 NOTE — Discharge Instructions (Addendum)
  Post Anesthesia Home Care Instructions  Activity: Get plenty of rest for the remainder of the day. A responsible individual must stay with you for 24 hours following the procedure.  For the next 24 hours, DO NOT: -Drive a car -Paediatric nurse -Drink alcoholic beverages -Take any medication unless instructed by your physician -Make any legal decisions or sign important papers.  Meals: Start with liquid foods such as gelatin or soup. Progress to regular foods as tolerated. Avoid greasy, spicy, heavy foods. If nausea and/or vomiting occur, drink only clear liquids until the nausea and/or vomiting subsides. Call your physician if vomiting continues.  Special Instructions/Symptoms: Your throat may feel dry or sore from the anesthesia or the breathing tube placed in your throat during surgery. If this causes discomfort, gargle with warm salt water. The discomfort should disappear within 24 hours.  Next dose of Tylenol can be taken at 430pm today if needed. Next dose of Ibuprofen can be taken today at 630pm today if needed.

## 2023-01-03 NOTE — Anesthesia Preprocedure Evaluation (Addendum)
Anesthesia Evaluation  Patient identified by MRN, date of birth, ID band Patient awake    Reviewed: Allergy & Precautions, H&P , NPO status , Patient's Chart, lab work & pertinent test results  Airway Mallampati: II  TM Distance: >3 FB Neck ROM: Full    Dental no notable dental hx. (+) Teeth Intact, Dental Advisory Given   Pulmonary neg pulmonary ROS   Pulmonary exam normal breath sounds clear to auscultation       Cardiovascular negative cardio ROS  Rhythm:Regular Rate:Normal     Neuro/Psych negative neurological ROS  negative psych ROS   GI/Hepatic negative GI ROS, Neg liver ROS,,,  Endo/Other  negative endocrine ROS    Renal/GU negative Renal ROS  negative genitourinary   Musculoskeletal   Abdominal   Peds  Hematology negative hematology ROS (+)   Anesthesia Other Findings   Reproductive/Obstetrics negative OB ROS                             Anesthesia Physical Anesthesia Plan  ASA: 2  Anesthesia Plan: General   Post-op Pain Management: Tylenol PO (pre-op)*   Induction: Intravenous  PONV Risk Score and Plan: 4 or greater and Ondansetron, Dexamethasone and Midazolam  Airway Management Planned: Oral ETT  Additional Equipment:   Intra-op Plan:   Post-operative Plan: Extubation in OR  Informed Consent: I have reviewed the patients History and Physical, chart, labs and discussed the procedure including the risks, benefits and alternatives for the proposed anesthesia with the patient or authorized representative who has indicated his/her understanding and acceptance.     Dental advisory given  Plan Discussed with: CRNA  Anesthesia Plan Comments:        Anesthesia Quick Evaluation

## 2023-01-03 NOTE — Op Note (Signed)
Laparoscopic Cholecystectomy Procedure Note  Indications: This patient presents with symptomatic gallbladder disease and will undergo laparoscopic cholecystectomy.  Pre-operative Diagnosis: symptomatic cholelithiasis  Post-operative Diagnosis: same  Surgeon: Coralie Keens   Assistants: none  Anesthesia: General endotracheal anesthesia  ASA Class: 2  Procedure Details  The patient was seen again in the Holding Room. The risks, benefits, complications, treatment options, and expected outcomes were discussed with the patient. The possibilities of reaction to medication, pulmonary aspiration, perforation of viscus, bleeding, recurrent infection, finding a normal gallbladder, the need for additional procedures, failure to diagnose a condition, the possible need to convert to an open procedure, and creating a complication requiring transfusion or operation were discussed with the patient. The likelihood of improving the patient's symptoms with return to their baseline status is good.  The patient and/or family concurred with the proposed plan, giving informed consent. The site of surgery properly noted. The patient was taken to Operating Room, identified as Haley Holmes and the procedure verified as Laparoscopic Cholecystectomy with Intraoperative Cholangiogram. A Time Out was held and the above information confirmed.  Prior to the induction of general anesthesia, antibiotic prophylaxis was administered. General endotracheal anesthesia was then administered and tolerated well. After the induction, the abdomen was prepped with Chloraprep and draped in sterile fashion. The patient was positioned in the supine position.  Local anesthetic agent was injected into the skin near the umbilicus and an incision made. We dissected down to the abdominal fascia with blunt dissection.  The fascia was incised vertically and we entered the peritoneal cavity bluntly.  A pursestring suture of 0-Vicryl was placed  around the fascial opening.  The Hasson cannula was inserted and secured with the stay suture.  Pneumoperitoneum was then created with CO2 and tolerated well without any adverse changes in the patient's vital signs. A 5-mm port was placed in the subxiphoid position.  Two 5-mm ports were placed in the right upper quadrant. All skin incisions were infiltrated with a local anesthetic agent before making the incision and placing the trocars.   We positioned the patient in reverse Trendelenburg, tilted slightly to the patient's left.  The gallbladder was identified, the fundus grasped and retracted cephalad. Adhesions were lysed bluntly and with the electrocautery where indicated, taking care not to injure any adjacent organs or viscus. The infundibulum was grasped and retracted laterally, exposing the peritoneum overlying the triangle of Calot. This was then divided and exposed in a blunt fashion. The cystic duct was clearly identified and bluntly dissected circumferentially. A critical view of the cystic duct and cystic artery was obtained.  The cystic duct was then ligated with clips and divided. The cystic artery was, dissected free, ligated with clips and divided as well.   The gallbladder was dissected from the liver bed in retrograde fashion with the electrocautery. The gallbladder was removed and placed in an Endocatch sac. The liver bed was irrigated and inspected. Hemostasis was achieved with the electrocautery and a piece of surgical Snow. Copious irrigation was utilized and was repeatedly aspirated until clear.  The gallbladder and Endocatch sac were then removed through the umbilical port site.  The pursestring suture was used to close the umbilical fascia.    We again inspected the right upper quadrant for hemostasis.  Pneumoperitoneum was released as we removed the trocars.  4-0 Monocryl was used to close the skin.   Skin glue was then applied. The patient was then extubated and brought to the  recovery room in stable condition.  Instrument, sponge, and needle counts were correct at closure and at the conclusion of the case.   Findings: Chronic cholecystitis with Cholelithiasis  Estimated Blood Loss: Minimal         Drains: none         Specimens: Gallbladder           Complications: None; patient tolerated the procedure well.         Disposition: PACU - hemodynamically stable.         Condition: stable

## 2023-01-03 NOTE — Interval H&P Note (Signed)
History and Physical Interval Note: no change in H and P  01/03/2023 10:28 AM  Jobe Marker Caudillo  has presented today for surgery, with the diagnosis of SYMPTAMATIC GALLSTONES.  The various methods of treatment have been discussed with the patient and family. After consideration of risks, benefits and other options for treatment, the patient has consented to  Procedure(s): LAPAROSCOPIC CHOLECYSTECTOMY (N/A) as a surgical intervention.  The patient's history has been reviewed, patient examined, no change in status, stable for surgery.  I have reviewed the patient's chart and labs.  Questions were answered to the patient's satisfaction.     Coralie Keens

## 2023-01-03 NOTE — Anesthesia Procedure Notes (Signed)
Procedure Name: Intubation Date/Time: 01/03/2023 11:56 AM  Performed by: Bufford Spikes, CRNAPre-anesthesia Checklist: Patient identified, Emergency Drugs available, Suction available and Patient being monitored Patient Re-evaluated:Patient Re-evaluated prior to induction Oxygen Delivery Method: Circle system utilized Preoxygenation: Pre-oxygenation with 100% oxygen Induction Type: IV induction Ventilation: Mask ventilation without difficulty Laryngoscope Size: Miller and 2 Grade View: Grade I Tube type: Oral Tube size: 7.0 mm Number of attempts: 1 Airway Equipment and Method: Stylet and Oral airway Placement Confirmation: ETT inserted through vocal cords under direct vision, positive ETCO2 and breath sounds checked- equal and bilateral Secured at: 22 cm Tube secured with: Tape Dental Injury: Teeth and Oropharynx as per pre-operative assessment

## 2023-01-03 NOTE — Transfer of Care (Signed)
Immediate Anesthesia Transfer of Care Note  Patient: Haley Holmes  Procedure(s) Performed: LAPAROSCOPIC CHOLECYSTECTOMY (Abdomen)  Patient Location: PACU  Anesthesia Type:General  Level of Consciousness: awake, alert , and oriented  Airway & Oxygen Therapy: Patient Spontanous Breathing and Patient connected to face mask oxygen  Post-op Assessment: Report given to RN and Post -op Vital signs reviewed and stable  Post vital signs: Reviewed and stable  Last Vitals:  Vitals Value Taken Time  BP 155/77 01/03/23 1255  Temp    Pulse 48 01/03/23 1255  Resp 17 01/03/23 1255  SpO2 100 % 01/03/23 1255  Vitals shown include unvalidated device data.  Last Pain:  Vitals:   01/03/23 1038  PainSc: 0-No pain      Patients Stated Pain Goal: 4 ([id number])  Complications: No notable events documented.

## 2023-01-04 ENCOUNTER — Encounter (HOSPITAL_BASED_OUTPATIENT_CLINIC_OR_DEPARTMENT_OTHER): Payer: Self-pay | Admitting: Surgery

## 2023-01-04 NOTE — Anesthesia Postprocedure Evaluation (Signed)
Anesthesia Post Note  Patient: Haley Holmes  Procedure(s) Performed: LAPAROSCOPIC CHOLECYSTECTOMY (Abdomen)     Patient location during evaluation: PACU Anesthesia Type: General Level of consciousness: awake and alert Pain management: pain level controlled Vital Signs Assessment: post-procedure vital signs reviewed and stable Respiratory status: spontaneous breathing, nonlabored ventilation and respiratory function stable Cardiovascular status: blood pressure returned to baseline and stable Postop Assessment: no apparent nausea or vomiting Anesthetic complications: no  No notable events documented.  Last Vitals:  Vitals:   01/03/23 1340 01/03/23 1358  BP:  (!) 151/77  Pulse: (!) 59 (!) 55  Resp: 13 16  Temp:  (!) 36.1 C  SpO2: 100% 99%    Last Pain:  Vitals:   01/04/23 0916  PainSc: 2                  Alaster Asfaw,W. EDMOND

## 2023-01-07 LAB — SURGICAL PATHOLOGY

## 2023-09-13 DIAGNOSIS — Z23 Encounter for immunization: Secondary | ICD-10-CM | POA: Diagnosis not present

## 2023-10-11 DIAGNOSIS — I10 Essential (primary) hypertension: Secondary | ICD-10-CM | POA: Diagnosis not present

## 2023-10-11 DIAGNOSIS — Z124 Encounter for screening for malignant neoplasm of cervix: Secondary | ICD-10-CM | POA: Diagnosis not present

## 2023-10-11 DIAGNOSIS — Z0001 Encounter for general adult medical examination with abnormal findings: Secondary | ICD-10-CM | POA: Diagnosis not present

## 2023-10-21 ENCOUNTER — Ambulatory Visit (HOSPITAL_COMMUNITY)
Admission: RE | Admit: 2023-10-21 | Discharge: 2023-10-21 | Disposition: A | Discharge status: Home / Self Care | Payer: 59 | Source: Ambulatory Visit | Attending: Family Medicine | Admitting: Family Medicine

## 2023-10-21 ENCOUNTER — Other Ambulatory Visit (HOSPITAL_COMMUNITY): Payer: Self-pay | Admitting: Family Medicine

## 2023-10-21 DIAGNOSIS — Z1231 Encounter for screening mammogram for malignant neoplasm of breast: Secondary | ICD-10-CM | POA: Insufficient documentation

## 2024-09-02 ENCOUNTER — Ambulatory Visit: Admitting: Physician Assistant

## 2024-09-02 ENCOUNTER — Other Ambulatory Visit (INDEPENDENT_AMBULATORY_CARE_PROVIDER_SITE_OTHER)

## 2024-09-02 ENCOUNTER — Encounter: Payer: Self-pay | Admitting: Physician Assistant

## 2024-09-02 DIAGNOSIS — M25562 Pain in left knee: Secondary | ICD-10-CM

## 2024-09-02 MED ORDER — LIDOCAINE HCL 1 % IJ SOLN
4.0000 mL | INTRAMUSCULAR | Status: AC | PRN
  Administered 2024-09-02: 4 mL

## 2024-09-02 MED ORDER — METHYLPREDNISOLONE ACETATE 40 MG/ML IJ SUSP
40.0000 mg | INTRAMUSCULAR | Status: AC | PRN
  Administered 2024-09-02: 40 mg via INTRA_ARTICULAR

## 2024-09-02 MED ORDER — MELOXICAM 7.5 MG PO TABS
7.5000 mg | ORAL_TABLET | Freq: Every day | ORAL | 0 refills | Status: AC
Start: 2024-09-02 — End: ?

## 2024-09-02 NOTE — Progress Notes (Signed)
 Office Visit Note   Patient: Haley Holmes           Date of Birth: June 28, 1966           MRN: [id number] Visit Date: 09/02/2024              Requested by: Marvine Rush, MD [address] [place],  [place] [address] PCP: Marvine Rush, MD   Assessment & Plan: Visit Diagnoses:  1. Left knee pain, unspecified chronicity     Plan: Pleasant 58 year old woman with a 6-week history of left lateral knee pain.  No previous history of difficulties with her knee.  She thinks this began when she was helping her husband who recently underwent joint replacement get off the toilet and turned her knee and awkward manner.  She denies any pop pain is focused laterally.  X-rays today do not show significant degenerative changes may have a chondral injury or a meniscus tear.  Exam is fairly benign with the exception of tenderness over the lateral compartment.  Will go forward with a steroid injection today.  She certainly can follow-up with Dr. Vernetta after this if she wants to.  I explained the risks and the benefits of the injection.  This will also be diagnostic if this does not help her could consider physical therapy and/or an MRI  Follow-Up Instructions: Return if symptoms worsen or fail to improve.   Orders:  Orders Placed This Encounter  Procedures   XR Knee 1-2 Views Left   Meds ordered this encounter  Medications   meloxicam (MOBIC) 7.5 MG tablet    Sig: Take 1 tablet (7.5 mg total) by mouth daily.    Dispense:  30 tablet    Refill:  0      Procedures: Large Joint Inj on 09/02/2024 9:43 AM Indications: pain and diagnostic evaluation Details: 25 G 1.5 in needle, anterolateral approach  Arthrogram: No  Medications: 40 mg methylPREDNISolone  acetate 40 MG/ML; 4 mL lidocaine  1 % Outcome: tolerated well, no immediate complications Procedure, treatment alternatives, risks and benefits explained, specific risks discussed. Consent was given by the patient.       Clinical Data: No  additional findings.   Subjective: Chief Complaint  Patient presents with   Left Knee - Pain    HPI pleasant 58 year old woman comes in today with a 6-week 6-week history of left lateral knee pain after helping her husband following his joint replacement.  She is been on Celebrex which really did not help her months she had tried some meloxicam which did seem to help her.  Has not had much trouble with this knee in the past.  Also had a steroid injection by her PCP  Review of Systems  All other systems reviewed and are negative.    Objective: Vital Signs: LMP 08/30/2017   Physical Exam Constitutional:      Appearance: Normal appearance.  Pulmonary:     Effort: Pulmonary effort is normal.  Skin:    General: Skin is warm and dry.  Neurological:     General: No focal deficit present.     Mental Status: She is alert and oriented to person, place, and time.     Ortho Exam Examination of her left knee she has no effusion no erythema compartments are soft and compressible negative Toula' sign she has good varus valgus anterior posterior stability.  She has no tenderness over the joint line she has no pain with terminal flexion extension no grinding.  She is tender over the lateral joint line neurovascular intact no signs of infection Specialty Comments:  No specialty comments available.  Imaging: XR Knee 1-2 Views Left Result Date: 09/02/2024 Radiographs of the left knee overall minimal degenerative changes no evidence of acute osseous injuries.  Well-maintained alignment    PMFS History: There are no active problems to display for this patient.  No past medical history on file.  No family history on file.  Past Surgical History:  Procedure Laterality Date   CHOLECYSTECTOMY N/A 01/03/2023   Procedure: LAPAROSCOPIC CHOLECYSTECTOMY;  Surgeon: Vernetta Berg, MD;  Location: [facility];  Service: General;  Laterality: N/A;   Social History   Occupational  History   Not on file  Tobacco Use   Smoking status: Never   Smokeless tobacco: Never  Substance and Sexual Activity   Alcohol use: Never   Drug use: Never   Sexual activity: Not on file

## 2024-09-15 ENCOUNTER — Other Ambulatory Visit (HOSPITAL_COMMUNITY): Payer: Self-pay | Admitting: Family Medicine

## 2024-09-15 DIAGNOSIS — Z1231 Encounter for screening mammogram for malignant neoplasm of breast: Secondary | ICD-10-CM

## 2024-10-21 ENCOUNTER — Ambulatory Visit (HOSPITAL_COMMUNITY)
Admission: RE | Admit: 2024-10-21 | Discharge: 2024-10-21 | Disposition: A | Discharge status: Home / Self Care | Source: Ambulatory Visit | Attending: Family Medicine | Admitting: Family Medicine

## 2024-10-21 DIAGNOSIS — Z1231 Encounter for screening mammogram for malignant neoplasm of breast: Secondary | ICD-10-CM | POA: Insufficient documentation
